# Patient Record
Sex: Male | Born: 2000 | Race: Black or African American | Hispanic: No | Marital: Single | Smoking: Never smoker
Health system: Southern US, Community
[De-identification: ages and names within clinical notes are randomized; demographics above are authoritative.]

---
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
## 2021-04-13 ENCOUNTER — Emergency Department
Admission: EM | Admit: 2021-04-13 | Discharge: 2021-04-13 | Discharge status: Against Medical Advice | Payer: No Typology Code available for payment source

## 2021-04-13 NOTE — ED Notes (Signed)
No answer when called several times from lobby for triage 

## 2021-04-13 NOTE — ED Notes (Signed)
No answer when called for triage 

## 2021-04-17 ENCOUNTER — Emergency Department
Admission: EM | Admit: 2021-04-17 | Discharge: 2021-04-17 | Disposition: A | Discharge status: Home / Self Care | Payer: Self-pay | Attending: Emergency Medicine | Admitting: Emergency Medicine

## 2021-04-17 ENCOUNTER — Encounter: Payer: Self-pay | Admitting: Emergency Medicine

## 2021-04-17 ENCOUNTER — Other Ambulatory Visit: Payer: Self-pay

## 2021-04-17 DIAGNOSIS — R21 Rash and other nonspecific skin eruption: Secondary | ICD-10-CM | POA: Insufficient documentation

## 2021-04-17 MED ORDER — PREDNISONE 50 MG PO TABS: 50.0000 mg | ORAL_TABLET | Freq: Every day | ORAL | 0 refills | Status: AC

## 2021-04-17 NOTE — Discharge Instructions (Signed)
Please follow-up with dermatology.

## 2021-04-17 NOTE — ED Provider Notes (Signed)
[facility] Emergency Department Provider Note   ____________________________________________    I have reviewed the triage vital signs and the nursing notes.   HISTORY  Chief Complaint Rash     HPI Frederick Peace. is a 20 y.o. male who presents with a diffuse rash, patient reports the rash has been there for weeks to month.  He thought that it may be eczema related because his sister has eczema so he has been using cream without any improvement.  He reports it does not itch very much.  Does not hurt.  No new medications.  Started on his knuckles and became diffuse.  No intraoral lesions  History reviewed. No pertinent past medical history.  There are no problems to display for this patient.   History reviewed. No pertinent surgical history.  Prior to Admission medications   Medication Sig Start Date End Date Taking? Authorizing Provider  predniSONE (DELTASONE) 50 MG tablet Take 1 tablet (50 mg total) by mouth daily with breakfast. 04/17/21  Yes Jene Every, MD  loratadine (CLARITIN) 10 MG tablet Take 10 mg by mouth daily.    [provider]  mometasone (NASONEX) 50 MCG/ACT nasal spray 2 sprays/nostril BID 03/22/13   Graylon Good, PA-C     Allergies Patient has no known allergies.  Family History  Problem Relation Age of Onset   Diabetes Mother     Social History Social History   Tobacco Use   Smoking status: Never   Smokeless tobacco: Never  Vaping Use   Vaping Use: Never used  Substance Use Topics   Alcohol use: No   Drug use: No    Review of Systems  Constitutional: No fever/chills  ENT: No sore throat.   Gastrointestinal: No abdominal pain.  No nausea, no vomiting.   Genitourinary: Negative for dysuria. Musculoskeletal: Negative for back pain. Skin: As above Neurological: Negative for headaches     ____________________________________________   PHYSICAL EXAM:  VITAL SIGNS: ED Triage Vitals   Enc Vitals Group     BP 04/17/21 1322 131/80     Pulse Rate 04/17/21 1322 73     Resp 04/17/21 1322 18     Temp 04/17/21 1322 97.8 F (36.6 C)     Temp Source 04/17/21 1322 Oral     SpO2 04/17/21 1322 98 %     Weight 04/17/21 1324 127 kg (280 lb)     Height 04/17/21 1324 1.88 m (6\' 2" )     Head Circumference --      Peak Flow --      Pain Score 04/17/21 1324 0     Pain Loc --      Pain Edu? --      Excl. in GC? --      Constitutional: Alert and oriented. No acute distress. Pleasant and interactive Eyes: Conjunctivae are normal.  Head: Atraumatic. Nose: No congestion/rhinnorhea. Mouth/Throat: Mucous membranes are moist.   Cardiovascular: Normal rate, regular rhythm.  Respiratory: Normal respiratory effort.  No retractions. Genitourinary: deferred Musculoskeletal: No lower extremity tenderness nor edema.   Neurologic:  Normal speech and language. No gross focal neurologic deficits are appreciated.   Skin:  Skin is warm, dry and intact.  Diffuse, mildly erythematous rash, macular, no intraoral lesions   ____________________________________________   LABS (all labs ordered are listed, but only abnormal results are displayed)  Labs Reviewed - No data to display ____________________________________________  EKG   ____________________________________________  RADIOLOGY   ____________________________________________   PROCEDURES  Procedure(s) performed: No  Procedures   Critical Care performed: No ____________________________________________   INITIAL IMPRESSION / ASSESSMENT AND PLAN / ED COURSE  Pertinent labs & imaging results that were available during my care of the patient were reviewed by me and considered in my medical decision making (see chart for details).   Patient well-appearing and in no acute distress, vitals normal, unclear cause of rash, patient has dermatology follow-up, will trial p.o.  prednisone   ____________________________________________   FINAL CLINICAL IMPRESSION(S) / ED DIAGNOSES  Final diagnoses:  Rash      NEW MEDICATIONS STARTED DURING THIS VISIT:  Discharge Medication List as of 04/17/2021  1:34 PM     START taking these medications   Details  predniSONE (DELTASONE) 50 MG tablet Take 1 tablet (50 mg total) by mouth daily with breakfast., Starting Mon 04/17/2021, Normal         Note:  This document was prepared using Dragon voice recognition software and may include unintentional dictation errors.    Jene Every, MD 04/17/21 [phone]

## 2021-04-17 NOTE — ED Triage Notes (Signed)
Patient to ER for c/o rash "all over" x2-3 weeks. Patient denies pain, +itchy. Has tried Eucerin cream without relief. Denies fevers.

## 2021-08-26 ENCOUNTER — Other Ambulatory Visit: Payer: Self-pay

## 2021-08-26 DIAGNOSIS — M533 Sacrococcygeal disorders, not elsewhere classified: Secondary | ICD-10-CM | POA: Diagnosis present

## 2021-08-26 DIAGNOSIS — Z5321 Procedure and treatment not carried out due to patient leaving prior to being seen by health care provider: Secondary | ICD-10-CM | POA: Diagnosis not present

## 2021-08-26 NOTE — ED Triage Notes (Signed)
Pt states is having pain in his coccyx and has a "lump" there. Pt denies h istory of pilonidal cyst or known abscess, pt denies fever and appears in on acute distress.

## 2021-08-27 ENCOUNTER — Emergency Department
Admission: EM | Admit: 2021-08-27 | Discharge: 2021-08-27 | Disposition: A | Discharge status: Home / Self Care | Payer: BC Managed Care – PPO | Attending: Emergency Medicine | Admitting: Emergency Medicine

## 2023-01-04 ENCOUNTER — Other Ambulatory Visit: Payer: Self-pay

## 2023-01-04 ENCOUNTER — Emergency Department
Admission: EM | Admit: 2023-01-04 | Discharge: 2023-01-04 | Disposition: A | Discharge status: Home / Self Care | Payer: BC Managed Care – PPO | Attending: Emergency Medicine | Admitting: Emergency Medicine

## 2023-01-04 DIAGNOSIS — L0501 Pilonidal cyst with abscess: Secondary | ICD-10-CM | POA: Diagnosis present

## 2023-01-04 MED ORDER — ACETAMINOPHEN 500 MG PO TABS
1000.0000 mg | ORAL_TABLET | Freq: Once | ORAL | Status: AC
  Administered 2023-01-04: 1000 mg via ORAL
  Filled 2023-01-04: qty 2

## 2023-01-04 MED ORDER — KETOROLAC TROMETHAMINE 15 MG/ML IJ SOLN
15.0000 mg | Freq: Once | INTRAMUSCULAR | Status: AC
  Administered 2023-01-04: 15 mg via INTRAMUSCULAR
  Filled 2023-01-04: qty 1

## 2023-01-04 MED ORDER — CEPHALEXIN 500 MG PO CAPS: 500.0000 mg | ORAL_CAPSULE | Freq: Two times a day (BID) | ORAL | 0 refills | Status: AC

## 2023-01-04 NOTE — Discharge Instructions (Addendum)
Please go to the following website to schedule new (and existing) patient appointments:   [email or web address]   The following is a list of primary care offices in the area who are accepting new patients at this time.  Please reach out to one of them directly and let them know you would like to schedule an appointment to follow up on an Emergency Department visit, and/or to establish a new primary care provider (PCP).  There are likely other primary care clinics in the are who are accepting new patients, but this is an excellent place to start:  [facility] Lead physician: Dr Angela Bacigalupo [address] [place], [place] [address] [phone]  [facility] Lead Physician: Dr Krichna Sowles [address], [place], [place] [address] [phone]  [facility]  Lead Physician: Dr Megan Kaspar [address], [place], [place] [address] [phone]  [facility] [place] [facility] Lead Physician: Dr Alex Karamalegos [address], [place], [place] [address] [phone]  [facility] [place] Lead Physician: Dr Laura Berglund [address], [place], [place] [address] [phone]   

## 2023-01-04 NOTE — ED Triage Notes (Signed)
Pt to ed from home POV for cyst on his tail bone. Pt is CAOx4, in no acute distress and ambulatory in triage. Pt unable to sit down at this time. Pt advised it has been there x 4 days.

## 2023-01-04 NOTE — ED Provider Notes (Signed)
[facility] Emergency Department Provider Note     Event Date/Time   First MD Initiated Contact with Patient 01/04/23 2008     (approximate)   History   Abscess (X 4 days)   HPI  Frederick Mckinney. is a 22 y.o. male with no previous past medical history who presents to the emergency department for an abscess on the intergluteal cleft x 4 day with pain described as constant throbbing.  Patient reports he has had this before in same area but it had drained on its own with no treatment.  Patient has tried taking Tylenol with no improvement.  Pain score 10/10.  No other complaints at this time.    Physical Exam   Triage Vital Signs: ED Triage Vitals [01/04/23 1712]  Enc Vitals Group     BP (!) 138/90     Pulse Rate 100     Resp 16     Temp 98 F (36.7 C)     Temp Source Oral     SpO2 98 %     Weight 220 lb 7.4 oz (100 kg)     Height 6' (1.829 m)     Head Circumference      Peak Flow      Pain Score 10     Pain Loc      Pain Edu?      Excl. in GC?     Most recent vital signs: Vitals:   01/04/23 1712 01/04/23 2140  BP: (!) 138/90 (!) 148/82  Pulse: 100 94  Resp: 16 17  Temp: 98 F (36.7 C) 98 F (36.7 C)  SpO2: 98% 98%    General Awake, no distress.  HEENT NCAT. PERRL. EOMI. No rhinorrhea. Mucous membranes are moist.  CV:  Good peripheral perfusion.  RESP:  Normal effort.  ABD:  No distention.  Other:  There is a 1 cm small pustule lesion on superior intergluteal cleft surrounded by approximately 2 cm of firm induration.  No fluctuant drainage.  Mild erythema and tender to palpation.   ED Results / Procedures / Treatments  History and physical examination do not warrant a lab work up or imaging at this time.   No results found.   PROCEDURES:  Critical Care performed: No  Procedures   MEDICATIONS ORDERED IN ED: Medications  ketorolac (TORADOL) 15 MG/ML injection 15 mg (15 mg Intramuscular Given 01/04/23 2144)   acetaminophen (TYLENOL) tablet 1,000 mg (1,000 mg Oral Given 01/04/23 2141)     IMPRESSION / MDM / ASSESSMENT AND PLAN / ED COURSE  I reviewed the triage vital signs and the nursing notes.                                22 y.o. male presents to the emergency department for evaluation and treatment of abscess clinically consistent with pilonidal cyst. See HPI for further details.    Differential diagnosis includes, but is not limited to pilonidal cyst, pilonidal abscess, folliculitis, hidradenitis suppurative    The patient was administered Toradol 15 mg Tylenol 1000 mg resulting in significant improvement of symptoms.  Given physical exam findings of induration of the soft tissue rather than fluctuant mass I decided there is no drainage for an I&D at this time.  Giving the erythemic surrounding area of the induration and initial pain level, patient will be prescribed empiric antibiotics for possible cellulitis. Patient is in stable condition  for discharge and outpatient follow up. Patient will be discharged home with prescriptions for Keflex 10-day course twice a day.  I encouraged using warm compresses over the affected area for spontaneous drainage and Tylenol and ibuprofen for pain as needed.  Patient is to follow up with primary care as needed or otherwise directed. Patient is given ED precautions to return to the ED for any worsening or new symptoms. Patient verbalizes understanding. All questions and concerns were addressed during ED visit.    Patient's presentation is most consistent with exacerbation of chronic illness.  FINAL CLINICAL IMPRESSION(S) / ED DIAGNOSES   Final diagnoses:  Pilonidal abscess     Rx / DC Orders   ED Discharge Orders          Ordered    cephALEXin (KEFLEX) 500 MG capsule  2 times daily        01/04/23 2212             Note:  This document was prepared using Dragon voice recognition software and may include unintentional dictation errors.     Romeo Apple, Mischa Pollard A, PA-C 01/04/23 2334    Minna Antis, MD 01/07/23 1441
